# Patient Record
Sex: Female | Born: 2005 | Race: White | Hispanic: Yes | Marital: Single | Smoking: Never smoker
Health system: Southern US, Community
[De-identification: ages and names within clinical notes are randomized; demographics above are authoritative.]

## PROBLEM LIST (undated history)

## (undated) HISTORY — PX: OTHER SURGICAL HISTORY: SHX169

## (undated) HISTORY — PX: CARDIAC SURGERY: SHX584

---
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
## 2024-01-12 ENCOUNTER — Ambulatory Visit (HOSPITAL_COMMUNITY)
Admission: EM | Admit: 2024-01-12 | Discharge: 2024-01-12 | Disposition: A | Discharge status: Home / Self Care | Attending: Emergency Medicine | Admitting: Emergency Medicine

## 2024-01-12 ENCOUNTER — Encounter (HOSPITAL_COMMUNITY): Payer: Self-pay

## 2024-01-12 DIAGNOSIS — B9789 Other viral agents as the cause of diseases classified elsewhere: Secondary | ICD-10-CM | POA: Diagnosis not present

## 2024-01-12 DIAGNOSIS — J988 Other specified respiratory disorders: Secondary | ICD-10-CM | POA: Diagnosis not present

## 2024-01-12 LAB — POC SARS CORONAVIRUS 2 AG -  ED: SARS Coronavirus 2 Ag: NEGATIVE

## 2024-01-12 MED ORDER — CETIRIZINE HCL 10 MG PO TABS: 10.0000 mg | ORAL_TABLET | Freq: Every day | ORAL | 0 refills | Status: AC

## 2024-01-12 MED ORDER — LIDOCAINE VISCOUS HCL 2 % MT SOLN: 15.0000 mL | OROMUCOSAL | 0 refills | Status: AC | PRN

## 2024-01-12 MED ORDER — AZELASTINE HCL 0.1 % NA SOLN: 2.0000 | Freq: Two times a day (BID) | NASAL | 0 refills | Status: AC

## 2024-01-12 NOTE — ED Provider Notes (Signed)
 [facility]    CSN: [id number] Arrival date & time: 01/12/24  1916      History   Chief Complaint Chief Complaint  Patient presents with   Cough    HPI Deborah Hodge is a 18 y.o. female.   Patient presents for congestion, runny nose, fever, sore throat, headache, body aches, and mild cough x 3 days.  Patient states that her throat is so sore that sometimes it makes it difficult to swallow.  Denies chest pain, shortness of breath, vomiting, diarrhea, abdominal pain.  Patient states that she has been taking Robitussin and Benadryl with some relief of symptoms.  The history is provided by the patient and medical records.  Cough   History reviewed. No pertinent past medical history.  There are no active problems to display for this patient.   Past Surgical History:  Procedure Laterality Date   arm surgery     CARDIAC SURGERY     at age 49    OB History   No obstetric history on file.      Home Medications    Prior to Admission medications   Medication Sig Start Date End Date Taking? Authorizing Provider  azelastine  (ASTELIN ) 0.1 % nasal spray Place 2 sprays into both nostrils 2 (two) times daily. Use in each nostril as directed 01/12/24  Yes Levora Reas A, NP  cetirizine  (ZYRTEC  ALLERGY) 10 MG tablet Take 1 tablet (10 mg total) by mouth daily. 01/12/24  Yes Rosevelt Constable, Ebonee Stober A, NP  lidocaine  (XYLOCAINE ) 2 % solution Use as directed 15 mLs in the mouth or throat as needed for mouth pain. 01/12/24  Yes Karon Packer, NP    Family History Family History  Problem Relation Age of Onset   Hypertension Mother    Diabetes Mother     Social History Social History   Tobacco Use   Smoking status: Never    Passive exposure: Never   Smokeless tobacco: Never  Vaping Use   Vaping status: Never Used  Substance Use Topics   Alcohol use: Never   Drug use: Never     Allergies   Amoxicillin and Penicillins   Review of Systems Review  of Systems  Respiratory:  Positive for cough.    Per HPI  Physical Exam Triage Vital Signs ED Triage Vitals  Encounter Vitals Group     BP 01/12/24 1935 106/72     Systolic BP Percentile --      Diastolic BP Percentile --      Pulse Rate 01/12/24 1935 72     Resp 01/12/24 1935 16     Temp 01/12/24 1935 98.3 F (36.8 C)     Temp Source 01/12/24 1935 Oral     SpO2 01/12/24 1935 97 %     Weight --      Height --      Head Circumference --      Peak Flow --      Pain Score 01/12/24 1937 0     Pain Loc --      Pain Education --      Exclude from Growth Chart --    No data found.  Updated Vital Signs BP 106/72 (BP Location: Left Arm)   Pulse 72   Temp 98.3 F (36.8 C) (Oral)   Resp 16   LMP 12/14/2023 (Approximate)   SpO2 97%   Visual Acuity Right Eye Distance:   Left Eye Distance:   Bilateral Distance:    Right Eye  Near:   Left Eye Near:    Bilateral Near:     Physical Exam Vitals and nursing note reviewed.  Constitutional:      General: She is awake. She is not in acute distress.    Appearance: Normal appearance. She is well-developed and well-groomed. She is not ill-appearing.  HENT:     Right Ear: Tympanic membrane, ear canal and external ear normal.     Left Ear: Tympanic membrane, ear canal and external ear normal.     Nose: Congestion and rhinorrhea present.     Mouth/Throat:     Mouth: Mucous membranes are moist.     Pharynx: Posterior oropharyngeal erythema and postnasal drip present. No pharyngeal swelling or oropharyngeal exudate.     Tonsils: No tonsillar exudate.  Cardiovascular:     Rate and Rhythm: Normal rate and regular rhythm.  Pulmonary:     Effort: Pulmonary effort is normal.     Breath sounds: Normal breath sounds.  Skin:    General: Skin is warm and dry.  Neurological:     Mental Status: She is alert.  Psychiatric:        Behavior: Behavior is cooperative.      UC Treatments / Results  Labs (all labs ordered are listed, but  only abnormal results are displayed) Labs Reviewed  POC SARS CORONAVIRUS 2 AG -  ED    EKG   Radiology No results found.  Procedures Procedures (including critical care time)  Medications Ordered in UC Medications - No data to display  Initial Impression / Assessment and Plan / UC Course  I have reviewed the triage vital signs and the nursing notes.  Pertinent labs & imaging results that were available during my care of the patient were reviewed by me and considered in my medical decision making (see chart for details).     Patient is well-appearing.  Vital stable.  Upon assessment congestion and rhinorrhea present, mild erythema with PND noted to pharynx.  Lungs clear bilaterally to auscultation.  COVID testing negative.  Discussed into the likely related to a viral illness.  Prescribed azelastine  nasal spray for congestion.  Prescribed lidocaine  as needed for throat pain.  Prescribed cetirizine  to take daily to help with cough and congestion.  Recommended continue Robitussin as needed for cough.  Recommended alternate between Tylenol  ibuprofen  as needed.  Discussed importance of hydration.  Discussed return precautions. Final Clinical Impressions(s) / UC Diagnoses   Final diagnoses:  Viral respiratory illness     Discharge Instructions      Your COVID test was negative today.  I believe her symptoms are likely related to a viral illness. Use azelastine  nasal spray twice daily to help with congestion. Use lidocaine  as needed for throat pain.  Gargle and spit this, avoid swallowing this. You can take cetirizine  once daily to help with congestion and cough as well. Continue taking Robitussin as needed for cough. Alternate between 650 mg of Tylenol  and 400 mg of ibuprofen  every 4-6 hours as needed for headache, sore throat, and fever. Make sure you are staying hydrated and getting plenty of rest. Return here if her symptoms persist or worsen.   ED Prescriptions      Medication Sig Dispense Auth. Provider   azelastine  (ASTELIN ) 0.1 % nasal spray Place 2 sprays into both nostrils 2 (two) times daily. Use in each nostril as directed 30 mL Levora Reas A, NP   cetirizine  (ZYRTEC  ALLERGY) 10 MG tablet Take 1 tablet (10 mg total)  by mouth daily. 30 tablet Levora Reas A, NP   lidocaine  (XYLOCAINE ) 2 % solution Use as directed 15 mLs in the mouth or throat as needed for mouth pain. 100 mL Levora Reas A, NP      PDMP not reviewed this encounter.   Levora Reas A, NP 01/12/24 2030

## 2024-01-12 NOTE — ED Triage Notes (Signed)
 Pt states cough fever and runny nose for the past 3 days.

## 2024-01-12 NOTE — Discharge Instructions (Signed)
 Your COVID test was negative today.  I believe her symptoms are likely related to a viral illness. Use azelastine  nasal spray twice daily to help with congestion. Use lidocaine  as needed for throat pain.  Gargle and spit this, avoid swallowing this. You can take cetirizine  once daily to help with congestion and cough as well. Continue taking Robitussin as needed for cough. Alternate between 650 mg of Tylenol  and 400 mg of ibuprofen  every 4-6 hours as needed for headache, sore throat, and fever. Make sure you are staying hydrated and getting plenty of rest. Return here if her symptoms persist or worsen.

## 2024-02-06 ENCOUNTER — Ambulatory Visit (HOSPITAL_COMMUNITY)
Admission: EM | Admit: 2024-02-06 | Discharge: 2024-02-06 | Disposition: A | Discharge status: Home / Self Care | Attending: Physician Assistant | Admitting: Physician Assistant

## 2024-02-06 ENCOUNTER — Encounter (HOSPITAL_COMMUNITY): Payer: Self-pay

## 2024-02-06 DIAGNOSIS — H109 Unspecified conjunctivitis: Secondary | ICD-10-CM

## 2024-02-06 DIAGNOSIS — B9689 Other specified bacterial agents as the cause of diseases classified elsewhere: Secondary | ICD-10-CM | POA: Diagnosis not present

## 2024-02-06 DIAGNOSIS — Z973 Presence of spectacles and contact lenses: Secondary | ICD-10-CM

## 2024-02-06 MED ORDER — TETRACAINE HCL 0.5 % OP SOLN
OPHTHALMIC | Status: AC
  Filled 2024-02-06: qty 4

## 2024-02-06 MED ORDER — OFLOXACIN 0.3 % OP SOLN
1.0000 [drp] | Freq: Four times a day (QID) | OPHTHALMIC | 0 refills | Status: AC
Start: 2024-02-06 — End: ?

## 2024-02-06 MED ORDER — FLUORESCEIN SODIUM 1 MG OP STRP
ORAL_STRIP | OPHTHALMIC | Status: AC
  Filled 2024-02-06: qty 1

## 2024-02-06 NOTE — ED Provider Notes (Signed)
 [facility]    CSN: [id number] Arrival date & time: 02/06/24  1745      History   Chief Complaint Chief Complaint  Patient presents with   Eye Pain    HPI Deborah Hodge is a 18 y.o. female.   Patient presents today with a several week history of bilateral eye irritation.  She reports that a few weeks ago she woke up and put in her contacts that she does every day and felt a discomfort in her right eye.  She has had ongoing discomfort for the past several weeks and this is spread to involve her left eye as well.  She reports associated blurred vision.  She has continued to wear contacts.  She reports taking them out each night and replacing them monthly as recommended.  Her last prescription was updated approximately 1 year ago.  She did contact her ophthalmologist but they could not see her until August prompting evaluation with our clinic.  She denies any recent illness including cough or congestion.  She has been applying Pataday drops as well as lubricating eyedrops without improvement of symptoms.  She denies any exposure to foreign particulate matter or chemical exposure.    History reviewed. No pertinent past medical history.  There are no active problems to display for this patient.   Past Surgical History:  Procedure Laterality Date   arm surgery     CARDIAC SURGERY     at age 37    OB History   No obstetric history on file.      Home Medications    Prior to Admission medications   Medication Sig Start Date End Date Taking? Authorizing Provider  ofloxacin (OCUFLOX) 0.3 % ophthalmic solution Place 1 drop into both eyes 4 (four) times daily. 02/06/24  Yes Keslie Gritz K, PA-C  azelastine  (ASTELIN ) 0.1 % nasal spray Place 2 sprays into both nostrils 2 (two) times daily. Use in each nostril as directed 01/12/24   Levora Reas A, NP  cetirizine  (ZYRTEC  ALLERGY) 10 MG tablet Take 1 tablet (10 mg total) by mouth daily. 01/12/24   Levora Reas  A, NP  lidocaine  (XYLOCAINE ) 2 % solution Use as directed 15 mLs in the mouth or throat as needed for mouth pain. 01/12/24   Karon Packer, NP    Family History Family History  Problem Relation Age of Onset   Hypertension Mother    Diabetes Mother     Social History Social History   Tobacco Use   Smoking status: Never    Passive exposure: Never   Smokeless tobacco: Never  Vaping Use   Vaping status: Never Used  Substance Use Topics   Alcohol use: Never   Drug use: Never     Allergies   Amoxicillin and Penicillins   Review of Systems Review of Systems  HENT:  Negative for congestion.   Eyes:  Positive for pain, discharge and redness. Negative for photophobia, itching and visual disturbance.  Gastrointestinal:  Negative for abdominal pain, diarrhea, nausea and vomiting.  Neurological:  Negative for dizziness, light-headedness and headaches.     Physical Exam Triage Vital Signs ED Triage Vitals [02/06/24 1800]  Encounter Vitals Group     BP 102/71     Systolic BP Percentile      Diastolic BP Percentile      Pulse Rate (!) 59     Resp 14     Temp 98.2 F (36.8 C)     Temp Source Oral  SpO2 98 %     Weight      Height      Head Circumference      Peak Flow      Pain Score 10     Pain Loc      Pain Education      Exclude from Growth Chart    No data found.  Updated Vital Signs BP 102/71 (BP Location: Left Arm)   Pulse (!) 59   Temp 98.2 F (36.8 C) (Oral)   Resp 14   LMP 01/12/2024 (Approximate)   SpO2 98%   Visual Acuity Right Eye Distance: 20/70 (Patient is wearing contacts) Left Eye Distance: 20/50 (Patient is wearing contacts) Bilateral Distance: 20/50 (Patient is wearing contacts)  Right Eye Near:   Left Eye Near:    Bilateral Near:     Physical Exam Vitals reviewed.  Constitutional:      General: She is awake. She is not in acute distress.    Appearance: Normal appearance. She is well-developed. She is not ill-appearing.      Comments: Very pleasant female appears stated age in no acute distress sitting comfortably in exam room  HENT:     Head: Normocephalic and atraumatic.  Eyes:     General:        Right eye: No discharge or hordeolum.        Left eye: No discharge or hordeolum.     Extraocular Movements: Extraocular movements intact.     Conjunctiva/sclera:     Right eye: Right conjunctiva is injected. No chemosis.    Left eye: Left conjunctiva is injected. No chemosis.    Pupils: Pupils are equal, round, and reactive to light.     Right eye: No corneal abrasion or fluorescein  uptake. Seidel exam negative.     Left eye: No corneal abrasion or fluorescein  uptake. Seidel exam negative.    Comments: No fluorescein  uptake or corneal abrasion on exam.  She did have improvement of discomfort following application of tetracaine .  No foreign body noted.  Cardiovascular:     Rate and Rhythm: Normal rate and regular rhythm.     Heart sounds: Normal heart sounds, S1 normal and S2 normal. No murmur heard. Pulmonary:     Effort: Pulmonary effort is normal.     Breath sounds: Normal breath sounds. No wheezing, rhonchi or rales.     Comments: Clear to auscultation bilaterally Abdominal:     General: Bowel sounds are normal.     Palpations: Abdomen is soft.     Tenderness: There is no abdominal tenderness. There is no right CVA tenderness, left CVA tenderness, guarding or rebound.  Psychiatric:        Behavior: Behavior is cooperative.      UC Treatments / Results  Labs (all labs ordered are listed, but only abnormal results are displayed) Labs Reviewed - No data to display  EKG   Radiology No results found.  Procedures Procedures (including critical care time)  Medications Ordered in UC Medications - No data to display  Initial Impression / Assessment and Plan / UC Course  I have reviewed the triage vital signs and the nursing notes.  Pertinent labs & imaging results that were available during my  care of the patient were reviewed by me and considered in my medical decision making (see chart for details).     Patient is well-appearing, afebrile, nontoxic, nontachycardic.  I suspect her ongoing irritation is related to conjunctivitis with ongoing contact lens use  as she does report significant overnight drainage that has worsened in the past several days.  Fluorescein  staining was normal in clinic.  Will start ofloxacin 1 drop in each eye 4 times daily.  We discussed that she should wash her hands before handling medication and avoid touching tip of medication bottle to the eye as this can cause contamination of the medicine.  Recommend close follow-up with ophthalmology; she was given the contact information for provider on-call since her own ophthalmologist cannot get her in for several months.  She was instructed to call ophthalmology first thing tomorrow to schedule an appointment soon as possible.  We discussed that she should not use her contact lenses for at least the next week and instead use her glasses.  She can use lubricating eyedrops for additional symptom relief.  We discussed that if anything worsens or changes she needs to be seen immediately.  Strict return precautions given.  All questions were answered to patient satisfaction.  Final Clinical Impressions(s) / UC Diagnoses   Final diagnoses:  Bacterial conjunctivitis of both eyes  Uses contact lenses     Discharge Instructions      Do not wear your contacts for the next week until after you have finished the drops.  Apply  ofloxacin 4 times daily.  Do not touch tip of medication bottle to the eye and wash your hands before you handle the medicine to prevent contamination of the medicine.  You can use lubricating eyedrops/artificial tears for additional symptom relief.  I recommend you follow-up with an ophthalmologist.  Call them to schedule appointment.  If anything worsens and you have vision change, eye pain, fever,  headache, nausea, vomiting you need to be seen immediately.   ED Prescriptions     Medication Sig Dispense Auth. Provider   ofloxacin (OCUFLOX) 0.3 % ophthalmic solution Place 1 drop into both eyes 4 (four) times daily. 5 mL Jalani Cullifer K, PA-C      PDMP not reviewed this encounter.   Budd Cargo, PA-C 02/06/24 1835

## 2024-02-06 NOTE — Discharge Instructions (Signed)
 Do not wear your contacts for the next week until after you have finished the drops.  Apply  ofloxacin 4 times daily.  Do not touch tip of medication bottle to the eye and wash your hands before you handle the medicine to prevent contamination of the medicine.  You can use lubricating eyedrops/artificial tears for additional symptom relief.  I recommend you follow-up with an ophthalmologist.  Call them to schedule appointment.  If anything worsens and you have vision change, eye pain, fever, headache, nausea, vomiting you need to be seen immediately.

## 2024-02-06 NOTE — ED Triage Notes (Signed)
 Patient reports that she woke 2 months ago and put her contact (daily use) in her left eye. Patient states she immediately had pain in her left eye. Patient states her vision is cloudy. Patient has continued to wear her contacts.

## 2024-07-13 ENCOUNTER — Encounter (HOSPITAL_COMMUNITY): Payer: Self-pay | Admitting: *Deleted

## 2024-07-13 ENCOUNTER — Other Ambulatory Visit: Payer: Self-pay

## 2024-07-13 ENCOUNTER — Emergency Department (HOSPITAL_COMMUNITY)
Admission: EM | Admit: 2024-07-13 | Discharge: 2024-07-14 | Discharge status: Against Medical Advice | Attending: Emergency Medicine | Admitting: Emergency Medicine

## 2024-07-13 DIAGNOSIS — Z5321 Procedure and treatment not carried out due to patient leaving prior to being seen by health care provider: Secondary | ICD-10-CM | POA: Insufficient documentation

## 2024-07-13 DIAGNOSIS — L509 Urticaria, unspecified: Secondary | ICD-10-CM | POA: Insufficient documentation

## 2024-07-13 MED ORDER — FAMOTIDINE 20 MG PO TABS
20.0000 mg | ORAL_TABLET | Freq: Once | ORAL | Status: AC
Start: 2024-07-13 — End: 2024-07-13
  Administered 2024-07-13: 20 mg via ORAL
  Filled 2024-07-13: qty 1

## 2024-07-13 MED ORDER — DIPHENHYDRAMINE HCL 25 MG PO CAPS
50.0000 mg | ORAL_CAPSULE | Freq: Once | ORAL | Status: AC
  Administered 2024-07-13: 50 mg via ORAL
  Filled 2024-07-13: qty 2

## 2024-07-13 NOTE — ED Triage Notes (Signed)
 Pt reports generalized hives for about 30 minutes. Unsure what caused reaction. No airway involvement.

## 2024-07-14 NOTE — ED Notes (Signed)
Pt called for room, no response. Not seen in lobby

## 2024-07-14 NOTE — ED Notes (Signed)
 The pt reports that approx one hour ago [place] she[place] suddenly she  felt like she was bitten by something around her neck and upper chest  hives on her throat and neck other not visible because they were under her clothes no difficulty breathing  she does not appear to have any other lesions no shortness of breath  no acute distress
